# Patient Record
Sex: Female | Born: 1985 | Race: White | Hispanic: No | Marital: Single | Smoking: Never smoker
Health system: Southern US, Community
[De-identification: ages and names within clinical notes are randomized; demographics above are authoritative.]

## PROBLEM LIST (undated history)

## (undated) DIAGNOSIS — N159 Renal tubulo-interstitial disease, unspecified: Secondary | ICD-10-CM

---

## 2008-07-26 ENCOUNTER — Ambulatory Visit: Payer: Self-pay | Admitting: Radiology

## 2008-07-26 ENCOUNTER — Emergency Department (HOSPITAL_BASED_OUTPATIENT_CLINIC_OR_DEPARTMENT_OTHER): Admission: EM | Admit: 2008-07-26 | Discharge: 2008-07-26 | Payer: Self-pay | Admitting: Emergency Medicine

## 2010-09-01 LAB — DIFFERENTIAL
Basophils Absolute: 0 10*3/uL (ref 0.0–0.1)
Eosinophils Absolute: 0 10*3/uL (ref 0.0–0.7)
Lymphocytes Relative: 2 % — ABNORMAL LOW (ref 12–46)
Lymphs Abs: 0.5 10*3/uL — ABNORMAL LOW (ref 0.7–4.0)
Monocytes Relative: 3 % (ref 3–12)

## 2010-09-01 LAB — URINALYSIS, ROUTINE W REFLEX MICROSCOPIC
Glucose, UA: 100 mg/dL — AB
Hgb urine dipstick: NEGATIVE
Specific Gravity, Urine: 1.018 (ref 1.005–1.030)
pH: 6 (ref 5.0–8.0)

## 2010-09-01 LAB — URINE MICROSCOPIC-ADD ON

## 2010-09-01 LAB — COMPREHENSIVE METABOLIC PANEL
AST: 50 U/L — ABNORMAL HIGH (ref 0–37)
Alkaline Phosphatase: 69 U/L (ref 39–117)
BUN: 21 mg/dL (ref 6–23)
CO2: 21 mEq/L (ref 19–32)
Chloride: 101 mEq/L (ref 96–112)
Creatinine, Ser: 3.5 mg/dL — ABNORMAL HIGH (ref 0.4–1.2)
GFR calc non Af Amer: 16 mL/min — ABNORMAL LOW (ref >60)
Potassium: 4.3 mEq/L (ref 3.5–5.1)
Total Bilirubin: 0.7 mg/dL (ref 0.3–1.2)

## 2010-09-01 LAB — CBC
HCT: 34.6 % — ABNORMAL LOW (ref 36.0–46.0)
MCV: 84.7 fL (ref 78.0–100.0)
RBC: 4.08 MIL/uL (ref 3.87–5.11)
WBC: 24.6 10*3/uL — ABNORMAL HIGH (ref 4.0–10.5)

## 2010-09-01 LAB — URINE CULTURE

## 2010-09-01 LAB — WET PREP, GENITAL: Trich, Wet Prep: NONE SEEN

## 2011-08-09 ENCOUNTER — Other Ambulatory Visit: Payer: Self-pay | Admitting: Orthopedic Surgery

## 2011-08-09 DIAGNOSIS — S43439A Superior glenoid labrum lesion of unspecified shoulder, initial encounter: Secondary | ICD-10-CM

## 2011-08-09 DIAGNOSIS — M25512 Pain in left shoulder: Secondary | ICD-10-CM

## 2011-08-18 ENCOUNTER — Ambulatory Visit
Admission: RE | Admit: 2011-08-18 | Discharge: 2011-08-18 | Disposition: A | Discharge status: Home / Self Care | Payer: BC Managed Care – PPO | Source: Ambulatory Visit | Attending: Orthopedic Surgery | Admitting: Orthopedic Surgery

## 2011-08-18 DIAGNOSIS — S43439A Superior glenoid labrum lesion of unspecified shoulder, initial encounter: Secondary | ICD-10-CM

## 2011-08-18 DIAGNOSIS — M25512 Pain in left shoulder: Secondary | ICD-10-CM

## 2011-08-18 MED ORDER — IOHEXOL 180 MG/ML  SOLN: 15.0000 mL | Freq: Once | INTRAMUSCULAR | Status: AC | PRN

## 2011-08-30 ENCOUNTER — Other Ambulatory Visit: Payer: Self-pay | Admitting: Orthopedic Surgery

## 2011-08-30 DIAGNOSIS — M5412 Radiculopathy, cervical region: Secondary | ICD-10-CM

## 2011-08-30 DIAGNOSIS — M542 Cervicalgia: Secondary | ICD-10-CM

## 2011-09-07 ENCOUNTER — Ambulatory Visit
Admission: RE | Admit: 2011-09-07 | Discharge: 2011-09-07 | Disposition: A | Discharge status: Home / Self Care | Payer: BC Managed Care – PPO | Source: Ambulatory Visit | Attending: Orthopedic Surgery | Admitting: Orthopedic Surgery

## 2011-09-07 DIAGNOSIS — M542 Cervicalgia: Secondary | ICD-10-CM

## 2011-09-07 DIAGNOSIS — M5412 Radiculopathy, cervical region: Secondary | ICD-10-CM

## 2015-02-21 ENCOUNTER — Encounter (HOSPITAL_BASED_OUTPATIENT_CLINIC_OR_DEPARTMENT_OTHER): Payer: Self-pay | Admitting: Emergency Medicine

## 2015-02-21 ENCOUNTER — Emergency Department (HOSPITAL_BASED_OUTPATIENT_CLINIC_OR_DEPARTMENT_OTHER): Payer: BC Managed Care – PPO

## 2015-02-21 ENCOUNTER — Emergency Department (HOSPITAL_BASED_OUTPATIENT_CLINIC_OR_DEPARTMENT_OTHER)
Admission: EM | Admit: 2015-02-21 | Discharge: 2015-02-21 | Disposition: A | Discharge status: Home / Self Care | Payer: BC Managed Care – PPO | Attending: Physician Assistant | Admitting: Physician Assistant

## 2015-02-21 DIAGNOSIS — R1031 Right lower quadrant pain: Secondary | ICD-10-CM | POA: Diagnosis not present

## 2015-02-21 DIAGNOSIS — R109 Unspecified abdominal pain: Secondary | ICD-10-CM

## 2015-02-21 DIAGNOSIS — R110 Nausea: Secondary | ICD-10-CM | POA: Diagnosis not present

## 2015-02-21 DIAGNOSIS — R350 Frequency of micturition: Secondary | ICD-10-CM | POA: Insufficient documentation

## 2015-02-21 DIAGNOSIS — R6883 Chills (without fever): Secondary | ICD-10-CM | POA: Insufficient documentation

## 2015-02-21 DIAGNOSIS — Z3202 Encounter for pregnancy test, result negative: Secondary | ICD-10-CM | POA: Diagnosis not present

## 2015-02-21 DIAGNOSIS — Z880 Allergy status to penicillin: Secondary | ICD-10-CM | POA: Insufficient documentation

## 2015-02-21 DIAGNOSIS — Z87448 Personal history of other diseases of urinary system: Secondary | ICD-10-CM | POA: Insufficient documentation

## 2015-02-21 DIAGNOSIS — Z79899 Other long term (current) drug therapy: Secondary | ICD-10-CM | POA: Diagnosis not present

## 2015-02-21 HISTORY — DX: Renal tubulo-interstitial disease, unspecified: N15.9

## 2015-02-21 LAB — COMPREHENSIVE METABOLIC PANEL
ALBUMIN: 4.2 g/dL (ref 3.5–5.0)
ALT: 11 U/L — AB (ref 14–54)
AST: 19 U/L (ref 15–41)
Alkaline Phosphatase: 46 U/L (ref 38–126)
Anion gap: 10 (ref 5–15)
BUN: 9 mg/dL (ref 6–20)
CHLORIDE: 105 mmol/L (ref 101–111)
CO2: 22 mmol/L (ref 22–32)
CREATININE: 0.73 mg/dL (ref 0.44–1.00)
Calcium: 9 mg/dL (ref 8.9–10.3)
GFR calc non Af Amer: >60 mL/min (ref >60)
GLUCOSE: 83 mg/dL (ref 65–99)
Potassium: 3.5 mmol/L (ref 3.5–5.1)
SODIUM: 137 mmol/L (ref 135–145)
Total Bilirubin: 0.8 mg/dL (ref 0.3–1.2)
Total Protein: 7.9 g/dL (ref 6.5–8.1)

## 2015-02-21 LAB — CBC
HCT: 38.5 % (ref 36.0–46.0)
HEMOGLOBIN: 12.7 g/dL (ref 12.0–15.0)
MCH: 29.4 pg (ref 26.0–34.0)
MCHC: 33 g/dL (ref 30.0–36.0)
MCV: 89.1 fL (ref 78.0–100.0)
PLATELETS: 347 10*3/uL (ref 150–400)
RBC: 4.32 MIL/uL (ref 3.87–5.11)
RDW: 13 % (ref 11.5–15.5)
WBC: 9.1 10*3/uL (ref 4.0–10.5)

## 2015-02-21 LAB — URINALYSIS, ROUTINE W REFLEX MICROSCOPIC
BILIRUBIN URINE: NEGATIVE
Glucose, UA: NEGATIVE mg/dL
Hgb urine dipstick: NEGATIVE
Ketones, ur: NEGATIVE mg/dL
LEUKOCYTES UA: NEGATIVE
NITRITE: NEGATIVE
PH: 6 (ref 5.0–8.0)
Protein, ur: NEGATIVE mg/dL
SPECIFIC GRAVITY, URINE: 1.016 (ref 1.005–1.030)
UROBILINOGEN UA: 0.2 mg/dL (ref 0.0–1.0)

## 2015-02-21 LAB — PREGNANCY, URINE: PREG TEST UR: NEGATIVE

## 2015-02-21 MED ORDER — ONDANSETRON HCL 4 MG/2ML IJ SOLN
4.0000 mg | Freq: Once | INTRAMUSCULAR | Status: AC
  Administered 2015-02-21: 4 mg via INTRAVENOUS
  Filled 2015-02-21: qty 2

## 2015-02-21 MED ORDER — IOHEXOL 300 MG/ML  SOLN
50.0000 mL | Freq: Once | INTRAMUSCULAR | Status: AC | PRN
  Administered 2015-02-21: 50 mL via ORAL

## 2015-02-21 MED ORDER — ONDANSETRON 4 MG PO TBDP: 4.0000 mg | ORAL_TABLET | Freq: Three times a day (TID) | ORAL | Status: AC | PRN

## 2015-02-21 MED ORDER — IOHEXOL 300 MG/ML  SOLN
100.0000 mL | Freq: Once | INTRAMUSCULAR | Status: AC | PRN
  Administered 2015-02-21: 100 mL via INTRAVENOUS

## 2015-02-21 NOTE — ED Notes (Signed)
Pt states she has had RLQ pain since Thursday off and on today getting worse, seen at urgent care and told to come here

## 2015-02-21 NOTE — ED Notes (Signed)
EDPA in to see pt, at BS.  

## 2015-02-21 NOTE — ED Notes (Signed)
EDPA into room. Pt alert, NAD, calm, interactive, VSS, no dyspnea noted, c/o RLQ pain, reports nausea resolved after taking dramamine PTA, (denies: v,d,fever, bleeding, constipation, sob, back pain, vaginal sx, or other sx),  Reports recent frequency/ urgency, "thought it was another kidney infection", h/o same x2. Sent from UC, reports WBC was 15 PTA, last ate 1300, last BM today (normal), LMP 3 months ago ("takes 3 month pill"), reports flatus.

## 2015-02-21 NOTE — Discharge Instructions (Signed)
Please take Zofran as needed. You may also take ibuprofen for pain relief. Please follow up with your primary care provider in 3 days. Please return to the Emergency Department if symptoms worsen or new onset of fever, vomiting, blood in urine or emesis.

## 2015-02-21 NOTE — ED Notes (Signed)
Pt in CT at this time.

## 2015-02-21 NOTE — ED Provider Notes (Signed)
CSN: [id number]     Arrival date & time 02/21/15  1850 History   First MD Initiated Contact with Patient 02/21/15 1922     Chief Complaint  Patient presents with  . RLQ pain      (Consider location/radiation/quality/duration/timing/severity/associated sxs/prior Treatment) HPI Comments: Pt is a 29 yo female who presents to the ED with complaint of RLQ pain, onset 4 days. Pt reports having intermittent aching RLQ pain that worsens with movement. She notes the pain radiates to her right flank. Endorses nausea, urinary frequency, chills. Denies fever, headache, sore throat, SOB, CP, cough, vomiting, diarrhea, dysuria, vaginal bleeding, vaginal d/c. Pt reports her LMP was 3 months ago but notes she is on [facility] where she only gets her period once every 3 months. Denies being sexually active. Denies any abdominal surgeries. Pt reports she was seen at UC PTA, elevated WBC and was sent to the ED for suspected appendicitis. She notes that she has had pain similar to this in the past when she has pyelonephritis.   Past Medical History  Diagnosis Date  . Kidney infection    History reviewed. No pertinent past surgical history. History reviewed. No pertinent family history. Social History  Substance Use Topics  . Smoking status: Never Smoker   . Smokeless tobacco: None  . Alcohol Use: Yes   OB History    No data available     Review of Systems  Constitutional: Positive for chills.  Gastrointestinal: Positive for nausea and abdominal pain.  Genitourinary: Positive for frequency.  All other systems reviewed and are negative.     Allergies  Augmentin; Penicillins; and Sulfa antibiotics  Home Medications   Prior to Admission medications   Medication Sig Start Date End Date Taking? Authorizing Provider  norethindrone-ethinyl estradiol-iron (ESTROSTEP FE,TILIA FE,TRI-LEGEST FE) 1-20/1-30/1-35 MG-MCG tablet Take 1 tablet by mouth daily.   Yes Historical Provider, MD   BP 144/96 mmHg  Pulse  92  Temp(Src) 98.2 F (36.8 C) (Oral)  Resp 18  Ht  (1.549 m)  Wt 155 lb (70.308 kg)  BMI 29.30 kg/m2  SpO2 96% Physical Exam  Constitutional: She is oriented to person, place, and time. She appears well-developed and well-nourished. No distress.  HENT:  Head: Normocephalic and atraumatic.  Mouth/Throat: Oropharynx is clear and moist. No oropharyngeal exudate.  Eyes: Conjunctivae and EOM are normal. Right eye exhibits no discharge. Left eye exhibits no discharge. No scleral icterus.  Neck: Normal range of motion. Neck supple.  Cardiovascular: Normal rate, regular rhythm, normal heart sounds and intact distal pulses.   No murmur heard. Pulmonary/Chest: Effort normal and breath sounds normal. No respiratory distress. She has no wheezes. She has no rales. She exhibits no tenderness.  Abdominal: Soft. Bowel sounds are normal. She exhibits no distension and no mass. There is tenderness (RLQ). There is no rigidity, no rebound, no guarding, no CVA tenderness and no tenderness at McBurney's point.  Musculoskeletal: Normal range of motion. She exhibits no edema or tenderness.  Lymphadenopathy:    She has no cervical adenopathy.  Neurological: She is alert and oriented to person, place, and time.  Skin: Skin is warm and dry. She is not diaphoretic.  Nursing note and vitals reviewed.   ED Course  Procedures (including critical care time) Labs Review Labs Reviewed  URINALYSIS, ROUTINE W REFLEX MICROSCOPIC (NOT AT [facility])  PREGNANCY, URINE  CBC  COMPREHENSIVE METABOLIC PANEL    Imaging Review No results found. I have personally reviewed and evaluated these images and  lab results as part of my medical decision-making.  Filed Vitals:   02/21/15 2300  BP: 126/79  Pulse: 86  Temp:   Resp:    Meds given in ED:  Medications  ondansetron (ZOFRAN) injection 4 mg (not administered)  iohexol (OMNIPAQUE) 300 MG/ML solution 50 mL (50 mLs Oral Contrast Given 02/21/15 2040)  iohexol  (OMNIPAQUE) 300 MG/ML solution 100 mL (100 mLs Intravenous Contrast Given 02/21/15 2140)    New Prescriptions   ONDANSETRON (ZOFRAN ODT) 4 MG DISINTEGRATING TABLET    Take 1 tablet (4 mg total) by mouth every 8 (eight) hours as needed for nausea or vomiting.     MDM   Final diagnoses:  Abdominal pain, unspecified abdominal location  Nausea    Pt presents with RLQ pain with assocaited nausea, chills, and urinary frequency. Pt seen in UC PTA, elevated WBC, sent to ED for suspected appendicitis. VSS, RLQ mildly TTP, no peritoneal signs. Pt given zofran in ED. LAbs and UA unremarkable. Pregnancy negative. CT abdomen shows no acute abnormality, no appendicitis, no hydronephrosis. I do not feel that further workup or imaging is warranted at this time. I suspect pain may be due to menstrual period, she notes she should be starting her period in a few days. Plan to d/c pt home with zofran. Advised pt follow up with PCP in 3 days.   Evaluation does not show pathology requring ongoing emergent intervention or admission. Pt is hemodynamically stable and mentating appropriately. Discussed findings/results and plan with patient/guardian, who agrees with plan. All questions answered. Return precautions discussed and outpatient follow up given.      Satira Sark [place], [place] 02/21/15 2328  Courteney Randall An, MD 02/25/15 [phone]

## 2015-09-30 ENCOUNTER — Other Ambulatory Visit: Payer: Self-pay | Admitting: Orthopedic Surgery

## 2015-09-30 DIAGNOSIS — M4802 Spinal stenosis, cervical region: Secondary | ICD-10-CM

## 2015-09-30 DIAGNOSIS — M542 Cervicalgia: Secondary | ICD-10-CM

## 2015-10-07 ENCOUNTER — Ambulatory Visit
Admission: RE | Admit: 2015-10-07 | Discharge: 2015-10-07 | Disposition: A | Discharge status: Home / Self Care | Payer: BC Managed Care – PPO | Source: Ambulatory Visit | Attending: Orthopedic Surgery | Admitting: Orthopedic Surgery

## 2015-10-07 ENCOUNTER — Other Ambulatory Visit: Payer: BC Managed Care – PPO

## 2015-10-07 DIAGNOSIS — M4802 Spinal stenosis, cervical region: Secondary | ICD-10-CM

## 2015-10-07 DIAGNOSIS — M542 Cervicalgia: Secondary | ICD-10-CM

## 2020-01-16 ENCOUNTER — Other Ambulatory Visit (HOSPITAL_COMMUNITY): Payer: Self-pay | Admitting: Nurse Practitioner

## 2020-01-16 ENCOUNTER — Emergency Department (HOSPITAL_COMMUNITY)
Admission: EM | Admit: 2020-01-16 | Discharge: 2020-01-16 | Disposition: A | Discharge status: Home / Self Care | Payer: 59 | Attending: Emergency Medicine | Admitting: Emergency Medicine

## 2020-01-16 ENCOUNTER — Other Ambulatory Visit: Payer: Self-pay

## 2020-01-16 ENCOUNTER — Ambulatory Visit (HOSPITAL_COMMUNITY)
Admission: RE | Admit: 2020-01-16 | Discharge: 2020-01-16 | Disposition: A | Discharge status: Home / Self Care | Payer: 59 | Source: Ambulatory Visit | Attending: Pulmonary Disease | Admitting: Pulmonary Disease

## 2020-01-16 ENCOUNTER — Emergency Department (HOSPITAL_COMMUNITY): Payer: 59

## 2020-01-16 ENCOUNTER — Encounter (HOSPITAL_COMMUNITY): Payer: Self-pay | Admitting: Emergency Medicine

## 2020-01-16 DIAGNOSIS — U071 COVID-19: Secondary | ICD-10-CM | POA: Insufficient documentation

## 2020-01-16 DIAGNOSIS — R059 Cough, unspecified: Secondary | ICD-10-CM

## 2020-01-16 DIAGNOSIS — R0602 Shortness of breath: Secondary | ICD-10-CM | POA: Diagnosis present

## 2020-01-16 LAB — CBC
HCT: 40.7 % (ref 36.0–46.0)
Hemoglobin: 13 g/dL (ref 12.0–15.0)
MCH: 28.9 pg (ref 26.0–34.0)
MCHC: 31.9 g/dL (ref 30.0–36.0)
MCV: 90.4 fL (ref 80.0–100.0)
Platelets: 234 10*3/uL (ref 150–400)
RBC: 4.5 MIL/uL (ref 3.87–5.11)
RDW: 13.9 % (ref 11.5–15.5)
WBC: 5.3 10*3/uL (ref 4.0–10.5)
nRBC: 0 % (ref 0.0–0.2)

## 2020-01-16 LAB — BASIC METABOLIC PANEL
Anion gap: 12 (ref 5–15)
BUN: 11 mg/dL (ref 6–20)
CO2: 23 mmol/L (ref 22–32)
Calcium: 8.9 mg/dL (ref 8.9–10.3)
Chloride: 104 mmol/L (ref 98–111)
Creatinine, Ser: 0.61 mg/dL (ref 0.44–1.00)
GFR calc Af Amer: >60 mL/min (ref >60)
GFR calc non Af Amer: >60 mL/min (ref >60)
Glucose, Bld: 92 mg/dL (ref 70–99)
Potassium: 3.6 mmol/L (ref 3.5–5.1)
Sodium: 139 mmol/L (ref 135–145)

## 2020-01-16 LAB — SARS CORONAVIRUS 2 BY RT PCR (HOSPITAL ORDER, PERFORMED IN ~~LOC~~ HOSPITAL LAB): SARS Coronavirus 2: POSITIVE — AB

## 2020-01-16 LAB — I-STAT BETA HCG BLOOD, ED (MC, WL, AP ONLY): I-stat hCG, quantitative: <5 m[IU]/mL (ref <5)

## 2020-01-16 MED ORDER — ALBUTEROL SULFATE (2.5 MG/3ML) 0.083% IN NEBU: 5.0000 mg | INHALATION_SOLUTION | Freq: Once | RESPIRATORY_TRACT | Status: DC

## 2020-01-16 MED ORDER — METHYLPREDNISOLONE SODIUM SUCC 125 MG IJ SOLR: 125.0000 mg | Freq: Once | INTRAMUSCULAR | Status: DC | PRN

## 2020-01-16 MED ORDER — SODIUM CHLORIDE 0.9 % IV BOLUS
500.0000 mL | Freq: Once | INTRAVENOUS | Status: AC
  Administered 2020-01-16: 500 mL via INTRAVENOUS

## 2020-01-16 MED ORDER — FAMOTIDINE IN NACL 20-0.9 MG/50ML-% IV SOLN: 20.0000 mg | Freq: Once | INTRAVENOUS | Status: DC | PRN

## 2020-01-16 MED ORDER — ALBUTEROL SULFATE HFA 108 (90 BASE) MCG/ACT IN AERS: 2.0000 | INHALATION_SPRAY | Freq: Once | RESPIRATORY_TRACT | Status: DC | PRN

## 2020-01-16 MED ORDER — SODIUM CHLORIDE 0.9 % IV SOLN: INTRAVENOUS | Status: DC | PRN

## 2020-01-16 MED ORDER — SODIUM CHLORIDE 0.9 % IV SOLN
1200.0000 mg | Freq: Once | INTRAVENOUS | Status: AC
  Administered 2020-01-16: 1200 mg via INTRAVENOUS

## 2020-01-16 MED ORDER — EPINEPHRINE 0.3 MG/0.3ML IJ SOAJ: 0.3000 mg | Freq: Once | INTRAMUSCULAR | Status: DC | PRN

## 2020-01-16 MED ORDER — DIPHENHYDRAMINE HCL 50 MG/ML IJ SOLN: 50.0000 mg | Freq: Once | INTRAMUSCULAR | Status: DC | PRN

## 2020-01-16 NOTE — Discharge Instructions (Signed)

## 2020-01-16 NOTE — ED Provider Notes (Signed)
[facility]-EMERGENCY DEPT Provider Note   CSN: [id number] Arrival date & time: 01/16/20  1021     History Chief Complaint  Patient presents with  . covid positive  . Shortness of Breath    Donna Lyons is a 34 y.o. female.  34 year old female with prior medical history as detailed below presents for evaluation of Covid related shortness of breath.  Patient reports that she tested positive at an urgent care 7 days ago for Covid.  She complains of persistent cough and fevers.  She complains of feeling short of breath over the last 48 hours.  Her shortness of breath is the reason for evaluation today.  She denies nausea, vomiting, diarrhea, current chest pain, current shortness of breath.  She did not receive a Covid vaccination.    The history is provided by the patient and medical records.  Shortness of Breath Severity:  Mild Onset quality:  Gradual Duration:  6 days Timing:  Constant Progression:  Waxing and waning Chronicity:  New Context: activity   Relieved by:  Nothing Worsened by:  Nothing Ineffective treatments:  None tried Associated symptoms: cough and fever        Past Medical History:  Diagnosis Date  . Kidney infection     There are no problems to display for this patient.   History reviewed. No pertinent surgical history.   OB History   No obstetric history on file.     No family history on file.  Social History   Tobacco Use  . Smoking status: Never Smoker  Substance Use Topics  . Alcohol use: Yes  . Drug use: No    Home Medications Prior to Admission medications   Medication Sig Start Date End Date Taking? Authorizing Provider  norethindrone-ethinyl estradiol-iron (ESTROSTEP FE,TILIA FE,TRI-LEGEST FE) 1-20/1-30/1-35 MG-MCG tablet Take 1 tablet by mouth daily.    [provider]  ondansetron (ZOFRAN ODT) 4 MG disintegrating tablet Take 1 tablet (4 mg total) by mouth every 8 (eight) hours as needed for  nausea or vomiting. 02/21/15   Barrett Henle, PA-C    Allergies    Augmentin [amoxicillin-pot clavulanate], Penicillins, and Sulfa antibiotics  Review of Systems   Review of Systems  Constitutional: Positive for fever.  Respiratory: Positive for cough and shortness of breath.   All other systems reviewed and are negative.   Physical Exam Updated Vital Signs BP 139/76 (BP Location: Left Arm)   Pulse (!) 103   Temp 98.5 F (36.9 C) (Oral)   Resp 15   LMP 01/15/2020   SpO2 95%   Physical Exam Vitals and nursing note reviewed.  Constitutional:      General: She is not in acute distress.    Appearance: She is well-developed.  HENT:     Head: Normocephalic and atraumatic.  Eyes:     Conjunctiva/sclera: Conjunctivae normal.     Pupils: Pupils are equal, round, and reactive to light.  Cardiovascular:     Rate and Rhythm: Normal rate and regular rhythm.     Heart sounds: Normal heart sounds.  Pulmonary:     Effort: Pulmonary effort is normal. No respiratory distress.     Breath sounds: Normal breath sounds.  Abdominal:     General: There is no distension.     Palpations: Abdomen is soft.     Tenderness: There is no abdominal tenderness.  Musculoskeletal:        General: No deformity. Normal range of motion.  Cervical back: Normal range of motion and neck supple.  Skin:    General: Skin is warm and dry.  Neurological:     General: No focal deficit present.     Mental Status: She is alert and oriented to person, place, and time.     ED Results / Procedures / Treatments   Labs (all labs ordered are listed, but only abnormal results are displayed) Labs Reviewed  SARS CORONAVIRUS 2 BY RT PCR (HOSPITAL ORDER, PERFORMED IN [facility]) - Abnormal; Notable for the following components:      Result Value   SARS Coronavirus 2 POSITIVE (*)    All other components within normal limits  BASIC METABOLIC PANEL  CBC  I-STAT BETA HCG BLOOD, ED ([facility], [facility],  [facility] ONLY)    EKG None  Radiology DG Chest Port 1 View  Result Date: 01/16/2020 CLINICAL DATA:  Cough EXAM: PORTABLE CHEST 1 VIEW COMPARISON:  None. FINDINGS: Patchy opacities at the lung bases. No pleural effusion. No pneumothorax. Normal heart size. IMPRESSION: Patchy atelectasis/consolidation at the lung bases. Electronically Signed   By: Guadlupe Spanish M.D.   On: 01/16/2020 14:10    Procedures Procedures (including critical care time)  Medications Ordered in ED Medications  albuterol (PROVENTIL) (2.5 MG/3ML) 0.083% nebulizer solution 5 mg (5 mg Nebulization Not Given 01/16/20 1208)  sodium chloride 0.9 % bolus 500 mL (500 mLs Intravenous New Bag/Given 01/16/20 1304)    ED Course  I have reviewed the triage vital signs and the nursing notes.  Pertinent labs & imaging results that were available during my care of the patient were reviewed by me and considered in my medical decision making (see chart for details).    MDM Rules/Calculators/A&P                          MDM  Screen complete  Donna Lyons was evaluated in Emergency Department on 01/16/2020 for the symptoms described in the history of present illness. She was evaluated in the context of the global COVID-19 pandemic, which necessitated consideration that the patient might be at risk for infection with the SARS-CoV-2 virus that causes COVID-19. Institutional protocols and algorithms that pertain to the evaluation of patients at risk for COVID-19 are in a state of rapid change based on information released by regulatory bodies including the CDC and federal and state organizations. These policies and algorithms were followed during the patient's care in the ED.  Patient is presenting for evaluation of reported cough in the setting of recent diagnosis of Covid.  Patient is not hypoxic.  Patient is on approximately day 8-9 of symptoms.  Repeat Covid test performed today is positive.  Chest x-ray does demonstrate evidence of  mild Covid pneumonia.  Patient appears to meet criteria for monoclonal antibody infusion (BMI greater than 25).  Case discussed with infusion clinic [facility] RN, [phone]) who reports that the patient could be seen directly after ED evaluation for infusion.  MAB group contacted for screening.  Patient does understand plan of care.  She is in agreement with plan for experimental monoclonal antibody infusion.     Final Clinical Impression(s) / ED Diagnoses Final diagnoses:  COVID-19    Rx / DC Orders ED Discharge Orders    None       Wynetta Fines, MD 01/16/20 1513

## 2020-01-16 NOTE — Discharge Instructions (Addendum)
Go directly to monoclonal antibody infusion clinic at [facility].   Return for any problem.

## 2020-01-16 NOTE — ED Triage Notes (Signed)
Per pt, states she tested positive at an UC last Friday-states past 2 days she has been having CP, unsure if it is do to her coughing-states O2 fluctuates low 90's-

## 2020-01-16 NOTE — Progress Notes (Signed)
I connected by phone with Donna Lyons on 01/16/2020 at 3:28 PM to discuss the potential use of an new treatment for mild to moderate COVID-19 viral infection in non-hospitalized patients.  This patient is a 34 y.o. female that meets the FDA criteria for Emergency Use Authorization of casirivimab\imdevimab.  Has a (+) direct SARS-CoV-2 viral test result  Has mild or moderate COVID-19   Is ? 34 years of age and weighs ? 40 kg  Is NOT hospitalized due to COVID-19  Is NOT requiring oxygen therapy or requiring an increase in baseline oxygen flow rate due to COVID-19  Is within 10 days of symptom onset  Has at least one of the high risk factor(s) for progression to severe COVID-19 and/or hospitalization as defined in EUA.  Specific high risk criteria : BMI > 25  I have spoken and communicated the following to the patient or parent/caregiver:  1. FDA has authorized the emergency use of casirivimab\imdevimab for the treatment of mild to moderate COVID-19 in adults and pediatric patients with positive results of direct SARS-CoV-2 viral testing who are 10 years of age and older weighing at least 40 kg, and who are at high risk for progressing to severe COVID-19 and/or hospitalization.  2. The significant known and potential risks and benefits of casirivimab\imdevimab, and the extent to which such potential risks and benefits are unknown.  3. Information on available alternative treatments and the risks and benefits of those alternatives, including clinical trials.  4. Patients treated with casirivimab\imdevimab should continue to self-isolate and use infection control measures (e.g., wear mask, isolate, social distance, avoid sharing personal items, clean and disinfect "high touch" surfaces, and frequent handwashing) according to CDC guidelines.   5. The patient or parent/caregiver has the option to accept or refuse casirivimab\imdevimab .  After reviewing this information with the patient,  The patient agreed to proceed with receiving casirivimab\imdevimab infusion and will be provided a copy of the Fact sheet prior to receiving the infusion.Consuello Masse, DNP, AGNP-C [phone] (Infusion Center Hotline)

## 2020-01-16 NOTE — Progress Notes (Signed)
  Diagnosis: COVID-19  Physician: Dr Patrick Wright  Procedure: Covid Infusion Clinic Med: casirivimab\imdevimab infusion - Provided patient with casirivimab\imdevimab fact sheet for patients, parents and caregivers prior to infusion.  Complications: No immediate complications noted.  Discharge: Discharged home   Donna Lyons 01/16/2020  

## 2021-03-02 IMAGING — DX DG CHEST 1V PORT
1 series · 1 of 1 positions shown · non-contrast
Comparison: None.

CLINICAL DATA: Cough

EXAM:
PORTABLE CHEST 1 VIEW

[chest ap]
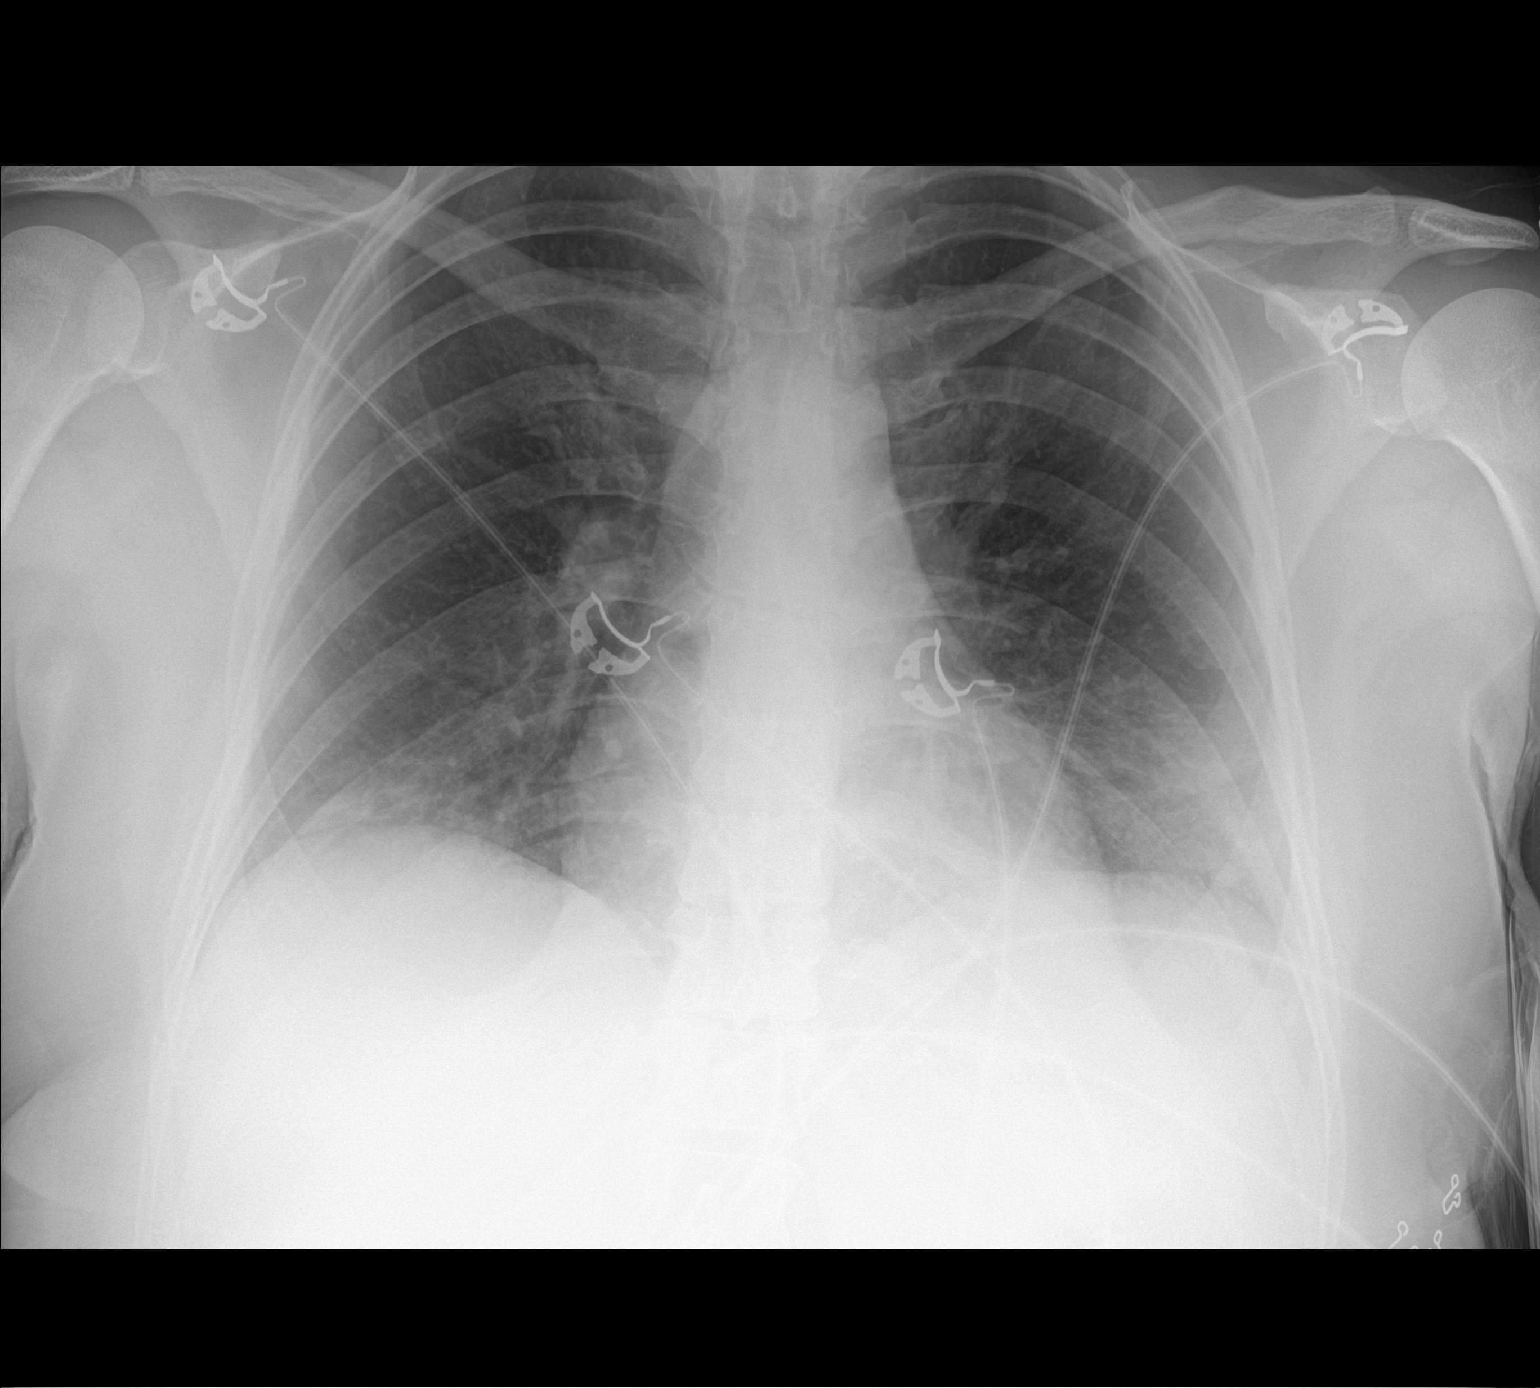

[1 of 1 positions shown; findings below may reference images not displayed]

FINDINGS: Patchy opacities at the lung bases. No pleural effusion. No
pneumothorax. Normal heart size.
IMPRESSION: Patchy atelectasis/consolidation at the lung bases.
